# Patient Record
Sex: Male | Born: 1945 | Race: White | Hispanic: No | Marital: Married | Smoking: Former smoker
Health system: Southern US, Community
[De-identification: ages and names within clinical notes are randomized; demographics above are authoritative.]

## PROBLEM LIST (undated history)

## (undated) DIAGNOSIS — E119 Type 2 diabetes mellitus without complications: Secondary | ICD-10-CM

## (undated) DIAGNOSIS — E785 Hyperlipidemia, unspecified: Secondary | ICD-10-CM

## (undated) DIAGNOSIS — N400 Enlarged prostate without lower urinary tract symptoms: Secondary | ICD-10-CM

## (undated) DIAGNOSIS — J309 Allergic rhinitis, unspecified: Secondary | ICD-10-CM

## (undated) DIAGNOSIS — I10 Essential (primary) hypertension: Secondary | ICD-10-CM

## (undated) DIAGNOSIS — Z87442 Personal history of urinary calculi: Secondary | ICD-10-CM

## (undated) DIAGNOSIS — E7800 Pure hypercholesterolemia, unspecified: Secondary | ICD-10-CM

## (undated) HISTORY — PX: CYSTOSCOPY PROSTATE W/ LASER: SUR372

## (undated) HISTORY — PX: COLONOSCOPY: SHX174

## (undated) HISTORY — PX: LITHOTRIPSY: SUR834

---

## 2008-11-27 ENCOUNTER — Emergency Department: Payer: Self-pay | Admitting: Emergency Medicine

## 2009-05-22 ENCOUNTER — Ambulatory Visit: Payer: Self-pay | Admitting: Gastroenterology

## 2010-03-25 ENCOUNTER — Ambulatory Visit: Payer: Self-pay | Admitting: General Practice

## 2010-04-24 ENCOUNTER — Ambulatory Visit: Payer: Self-pay | Admitting: Urology

## 2010-05-20 ENCOUNTER — Ambulatory Visit: Payer: Self-pay | Admitting: Urology

## 2010-07-01 ENCOUNTER — Ambulatory Visit: Payer: Self-pay | Admitting: Urology

## 2010-07-02 ENCOUNTER — Ambulatory Visit: Payer: Self-pay | Admitting: Urology

## 2010-07-08 ENCOUNTER — Ambulatory Visit: Payer: Self-pay | Admitting: Urology

## 2011-03-02 ENCOUNTER — Ambulatory Visit: Payer: Self-pay | Admitting: Urology

## 2011-03-03 ENCOUNTER — Ambulatory Visit: Payer: Self-pay | Admitting: Urology

## 2011-04-29 ENCOUNTER — Other Ambulatory Visit (HOSPITAL_COMMUNITY): Payer: Self-pay | Admitting: Radiology

## 2011-08-10 IMAGING — CT CT ABD-PELV W/O CM
1 of 2 series · 15 of 32 positions shown, 19 images · non-contrast
Comparison: none

REASON FOR EXAM: ON METFORMIN hematuria abdominal pain
COMMENTS:

PROCEDURE:     KCT - KCT ABDOMEN/PELVIS WO  - April 24, 2010  [DATE]
RESULT:     Comparison: None
TECHNIQUE: Multiple axial images from the lung bases to the symphysis pubis
were obtained without oral and without intravenous contrast.

[Series 2: stone 3.0 i40f · axial · 0.71mm/px · z∈[-945,-585]mm · 15 of 135 slices shown, 19 images]
[im 10/135  soft-tissue]
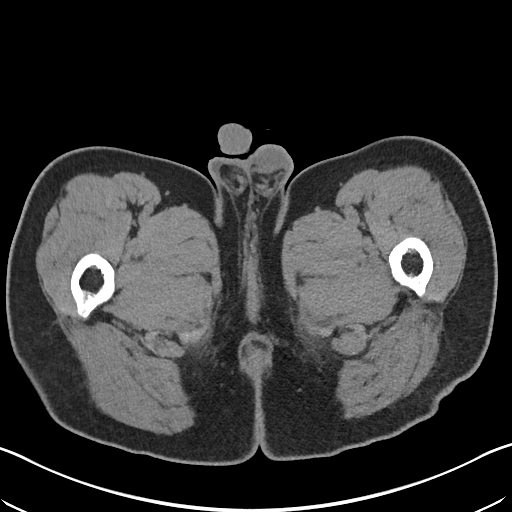
[im 10/135  bone]
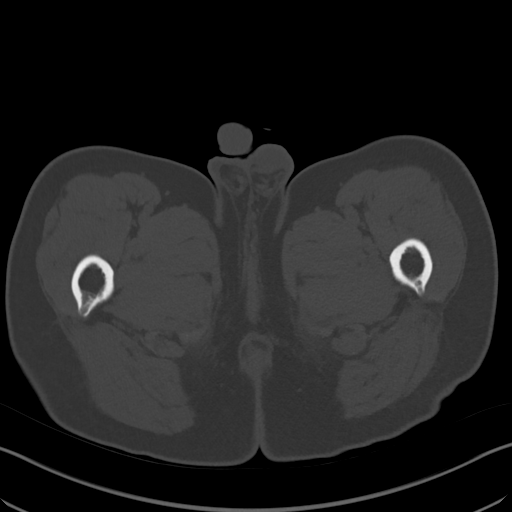
[im 20/135  soft-tissue]
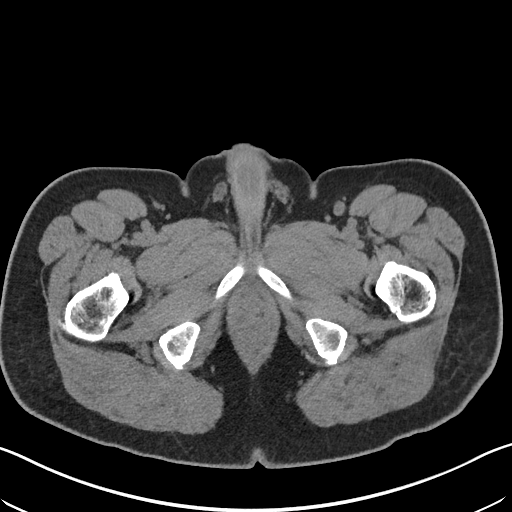
[im 30/135  soft-tissue]
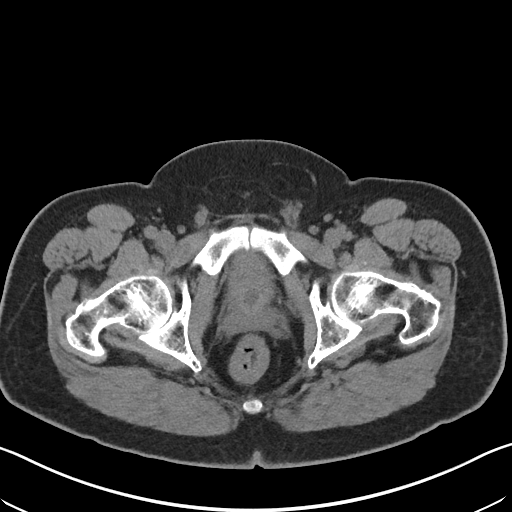
[im 40/135  soft-tissue]
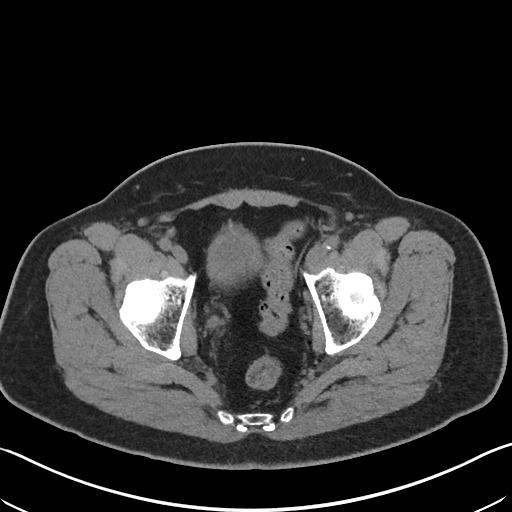
[im 50/135  soft-tissue]
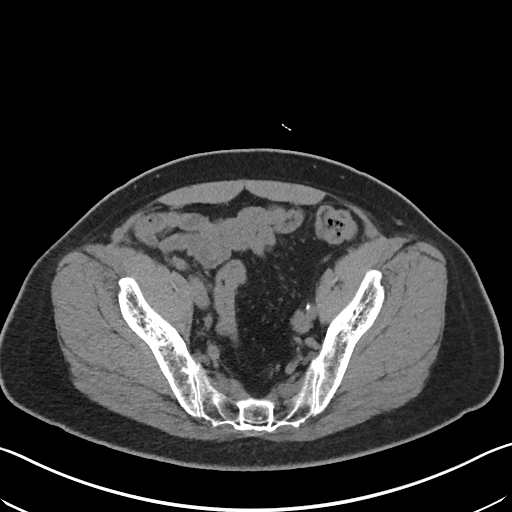
[im 60/135  soft-tissue]
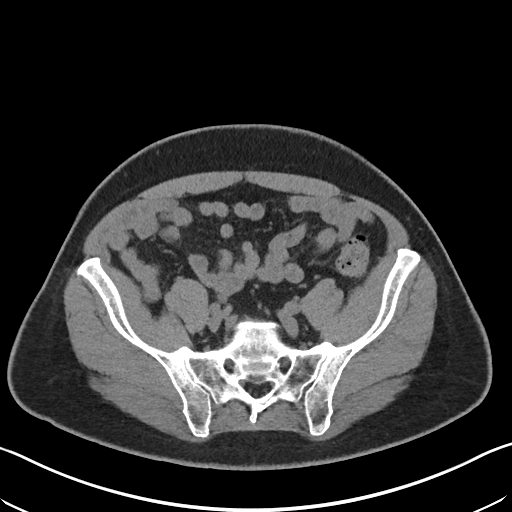
[im 70/135  soft-tissue]
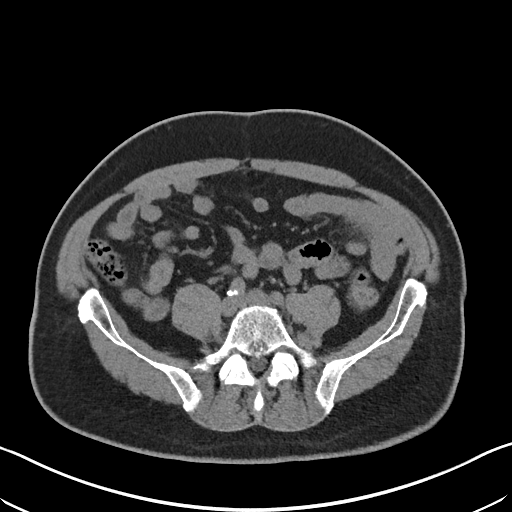
[im 80/135  soft-tissue]
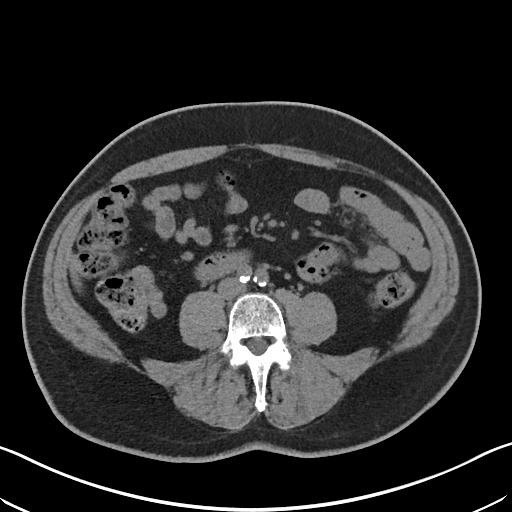
[im 90/135  soft-tissue]
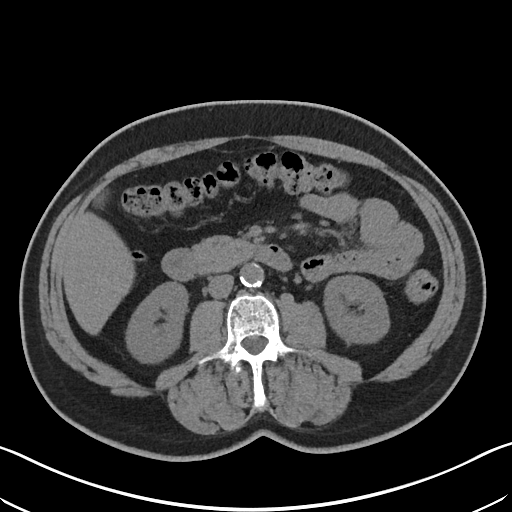
[im 90/135  bone]
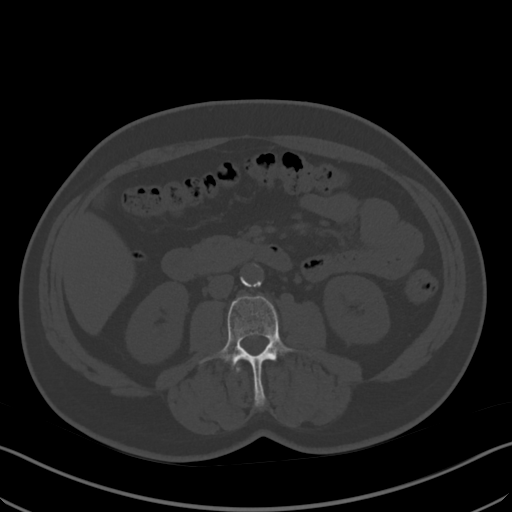
[im 100/135  soft-tissue]
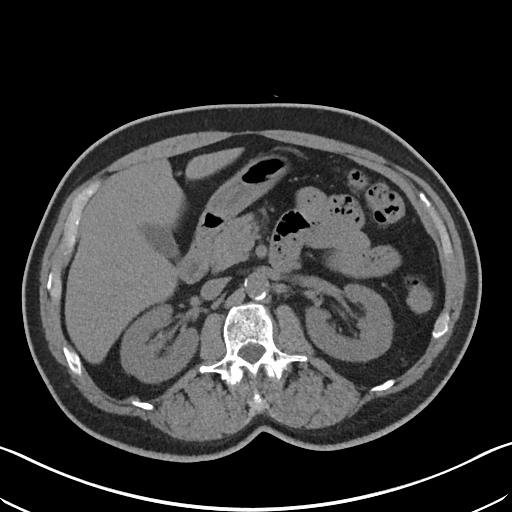
[im 110/135  soft-tissue]
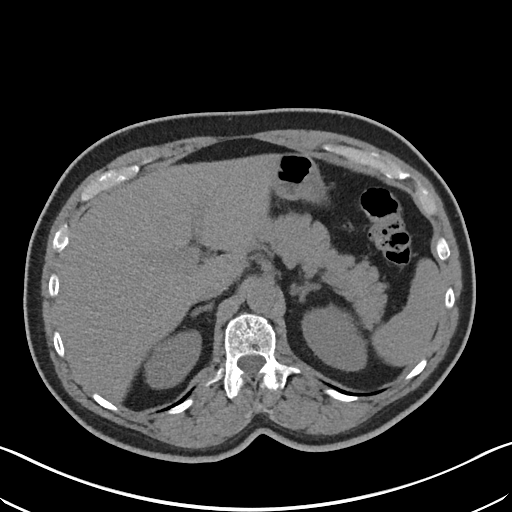
[im 115/135  lung]
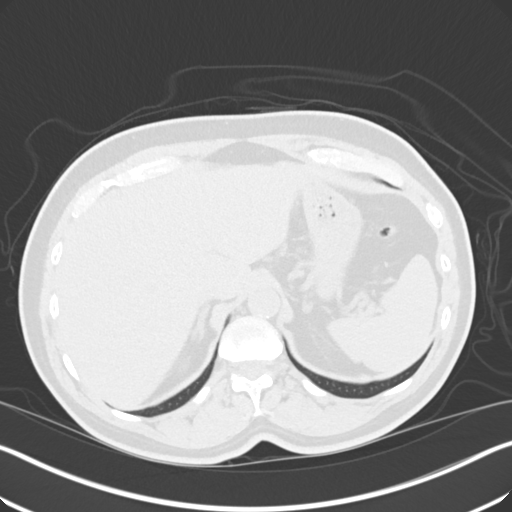
[im 120/135  soft-tissue]
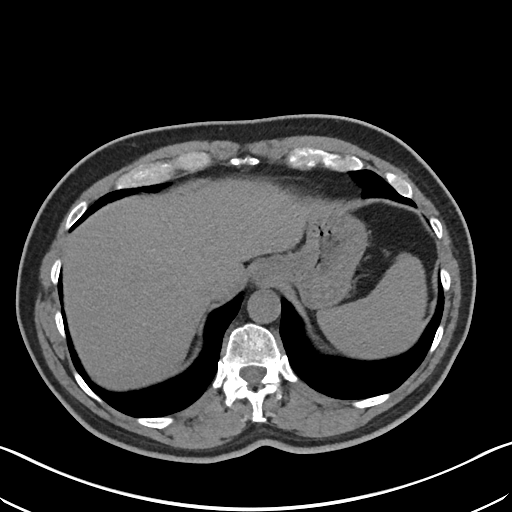
[im 120/135  lung]
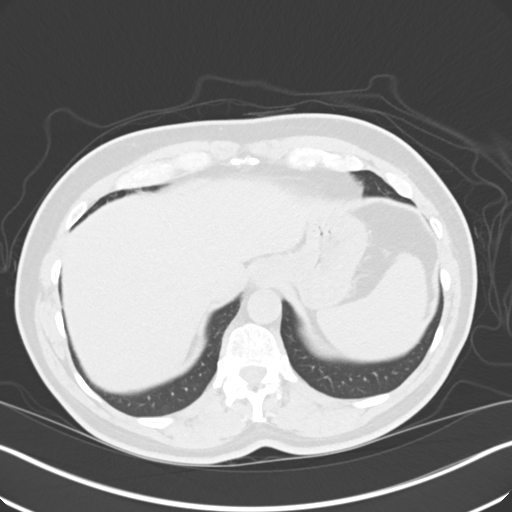
[im 125/135  lung]
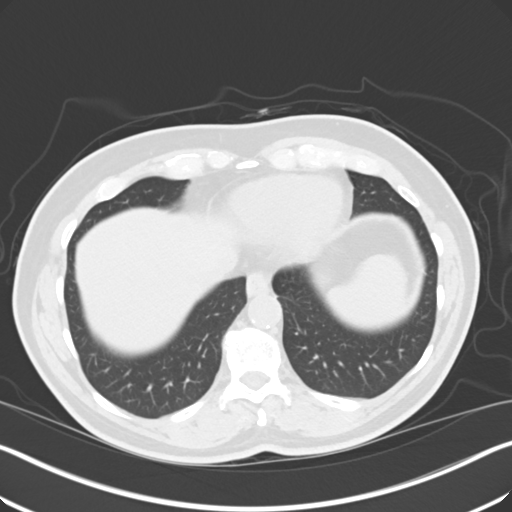
[im 130/135  soft-tissue]
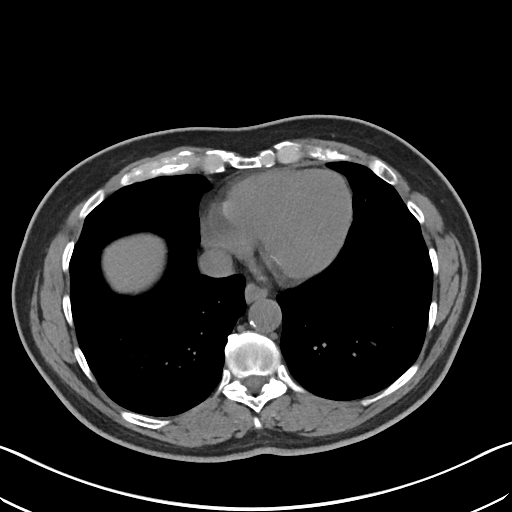
[im 130/135  lung]
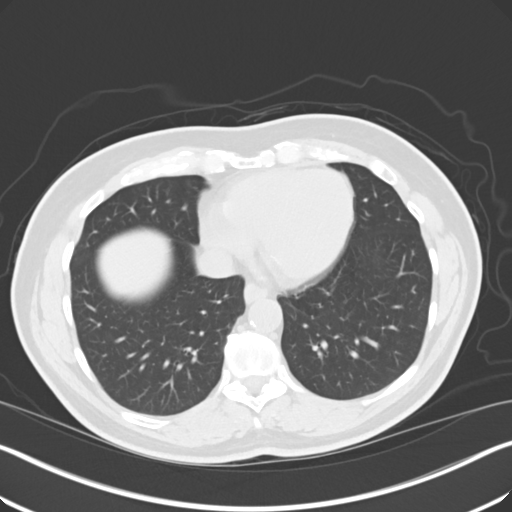

[15 of 32 positions shown; findings below may reference images not displayed]

FINDINGS: Lack of intravenous contrast limits evaluation the solid abdominal organs.
Grossly, the liver, spleen, pancreas are unremarkable. There are several
stones in the gallbladder. No CT evidence of acute cholecystitis. There is a
1.5 cm nodule in the left adrenal gland, with a density consistent with an
adenoma.

There are no renal calculi. There is a 6 mm down in the right ureterovesical
junction. There is mild dilatation of the mid and distal right ureter. No
hydronephrosis. There is a 2-3 mm calculus line dependently in the bladder.

There is diverticulosis of the sigmoid colon, without evidence of
diverticulitis. The appendix is normal. Mild thickening of the bladder wall
is likely related to underdistention. Atherosclerotic calcifications are
seen in the abdominal aorta and iliac arteries.

No aggressive lytic or sclerotic osseous lesions identified.
IMPRESSION: 1. 6 mm calculus at the right ureterovesical junction causing mild proximal
dilatation.
2. Left adrenal adenoma.
3. Cholelithiasis.

## 2012-02-29 ENCOUNTER — Ambulatory Visit: Payer: Self-pay | Admitting: Urology

## 2012-06-17 IMAGING — CR DG ABDOMEN 1V
1 series · 1 of 1 positions shown · non-contrast
Comparison: none

REASON FOR EXAM: Nephrolithiasis
COMMENTS:

PROCEDURE:     DXR - DXR KIDNEY URETER BLADDER  - March 02, 2011  [DATE]
RESULT:     Comparison is made to a prior exam of 07/01/2010.
No definite renal or ureteral calcifications are identified by routine
radiography. The bowel gas pattern is normal. No acute bony abnormalities
are seen.

[view not recorded]
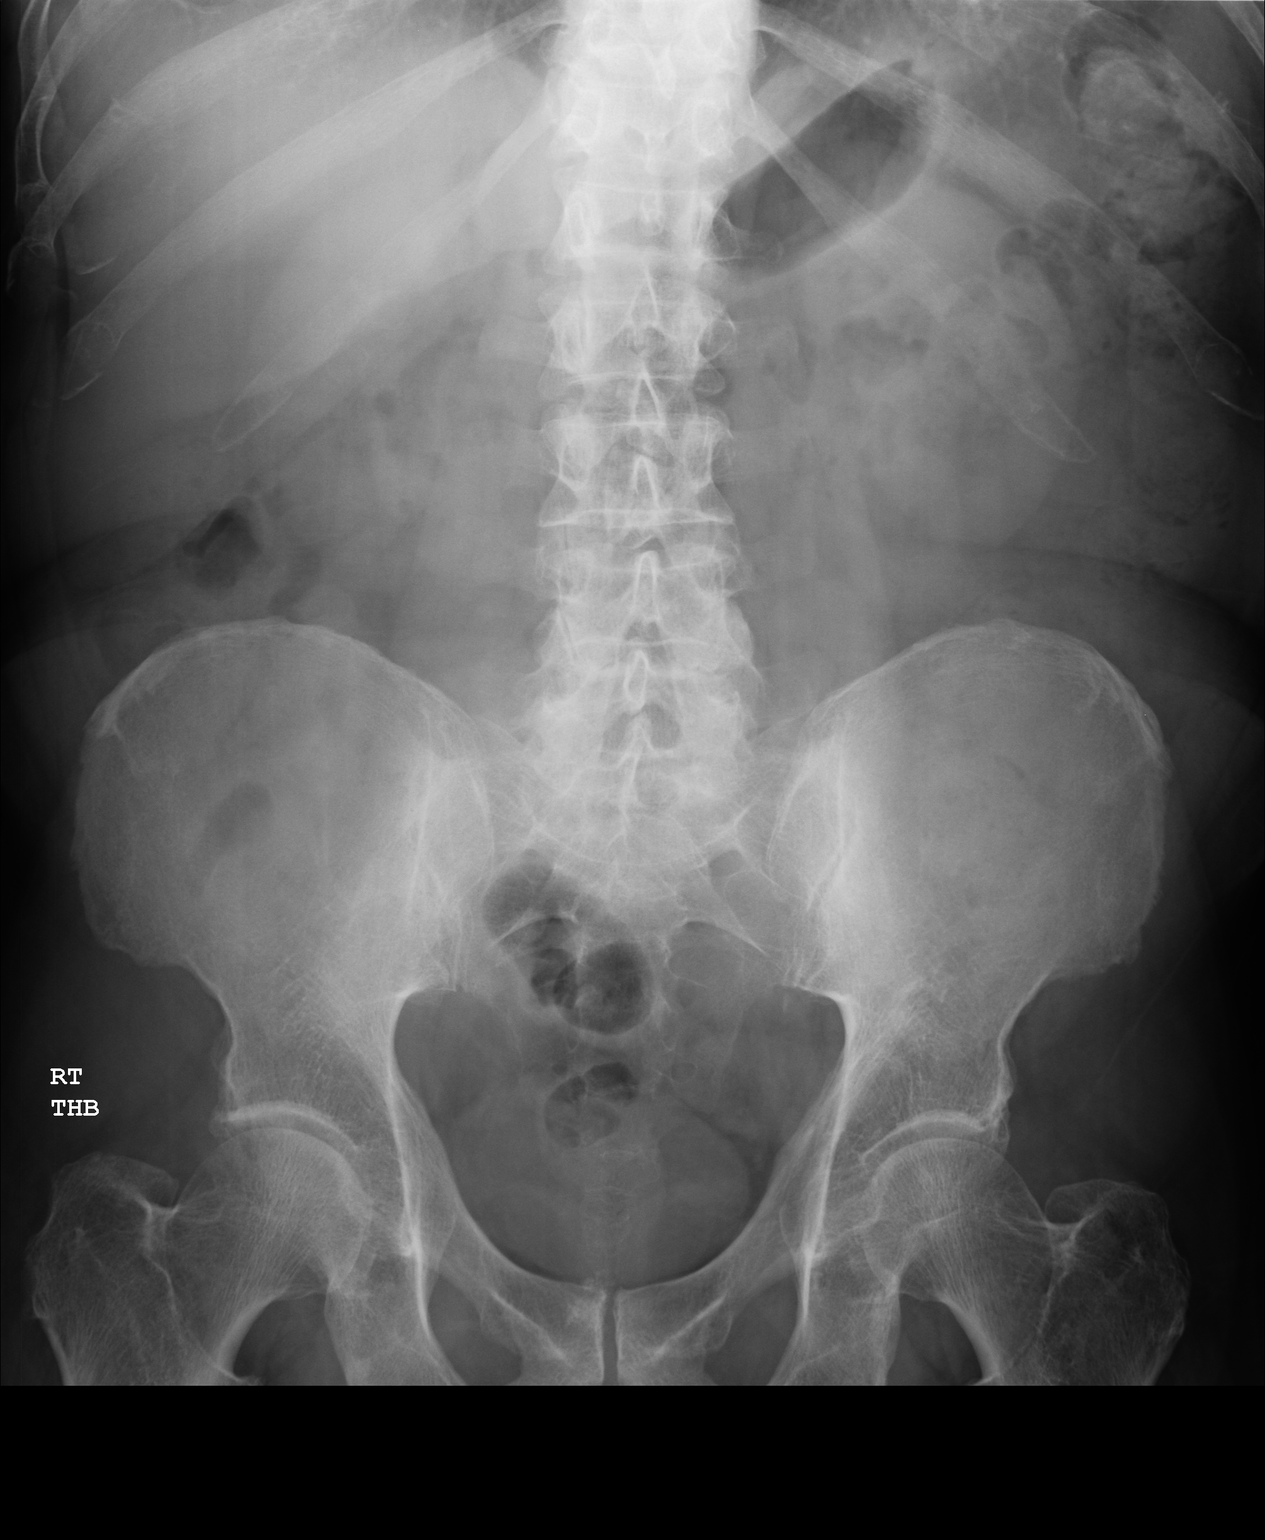

[1 of 1 positions shown; findings below may reference images not displayed]

IMPRESSION: No definite renal or ureteral calcifications are identified
by routine radiography.

## 2013-03-22 ENCOUNTER — Ambulatory Visit: Payer: Self-pay | Admitting: Urology

## 2014-07-08 IMAGING — CR DG ABDOMEN 1V
1 series · 2 of 2 positions shown · non-contrast
Comparison: none

REASON FOR EXAM: Nephrolothiasis, Renal Colic
COMMENTS:

PROCEDURE:     DXR - DXR KIDNEY URETER BLADDER  - March 22, 2013  [DATE]
RESULT:     Comparison is made to the study of 02/29/2012.
There is an unremarkable bowel gas pattern. No definite nephrolithiasis or
ureterolithiasis is appreciated. Degenerative changes are seen in the lower
lumbar spine.

[Series 1: supine kub · 0.17mm/px · 2 of 2 slices shown]
[im 1/2]
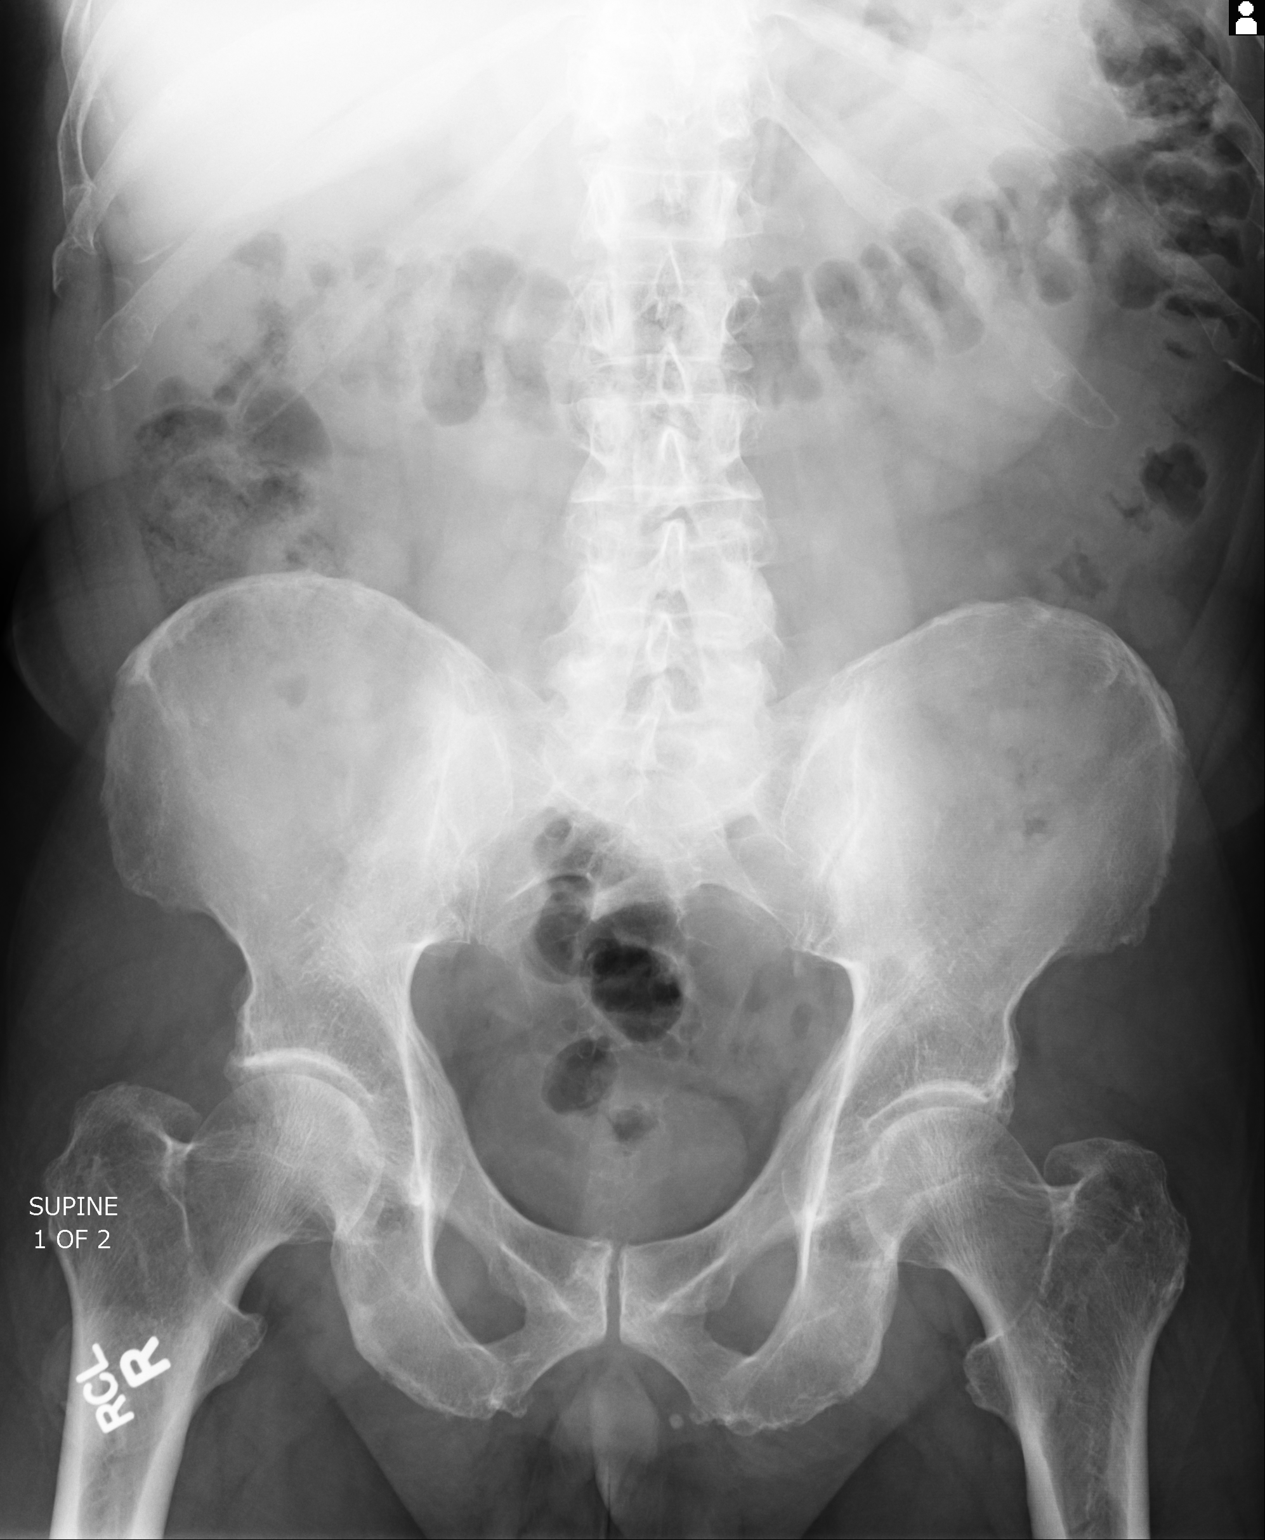
[im 2/2]
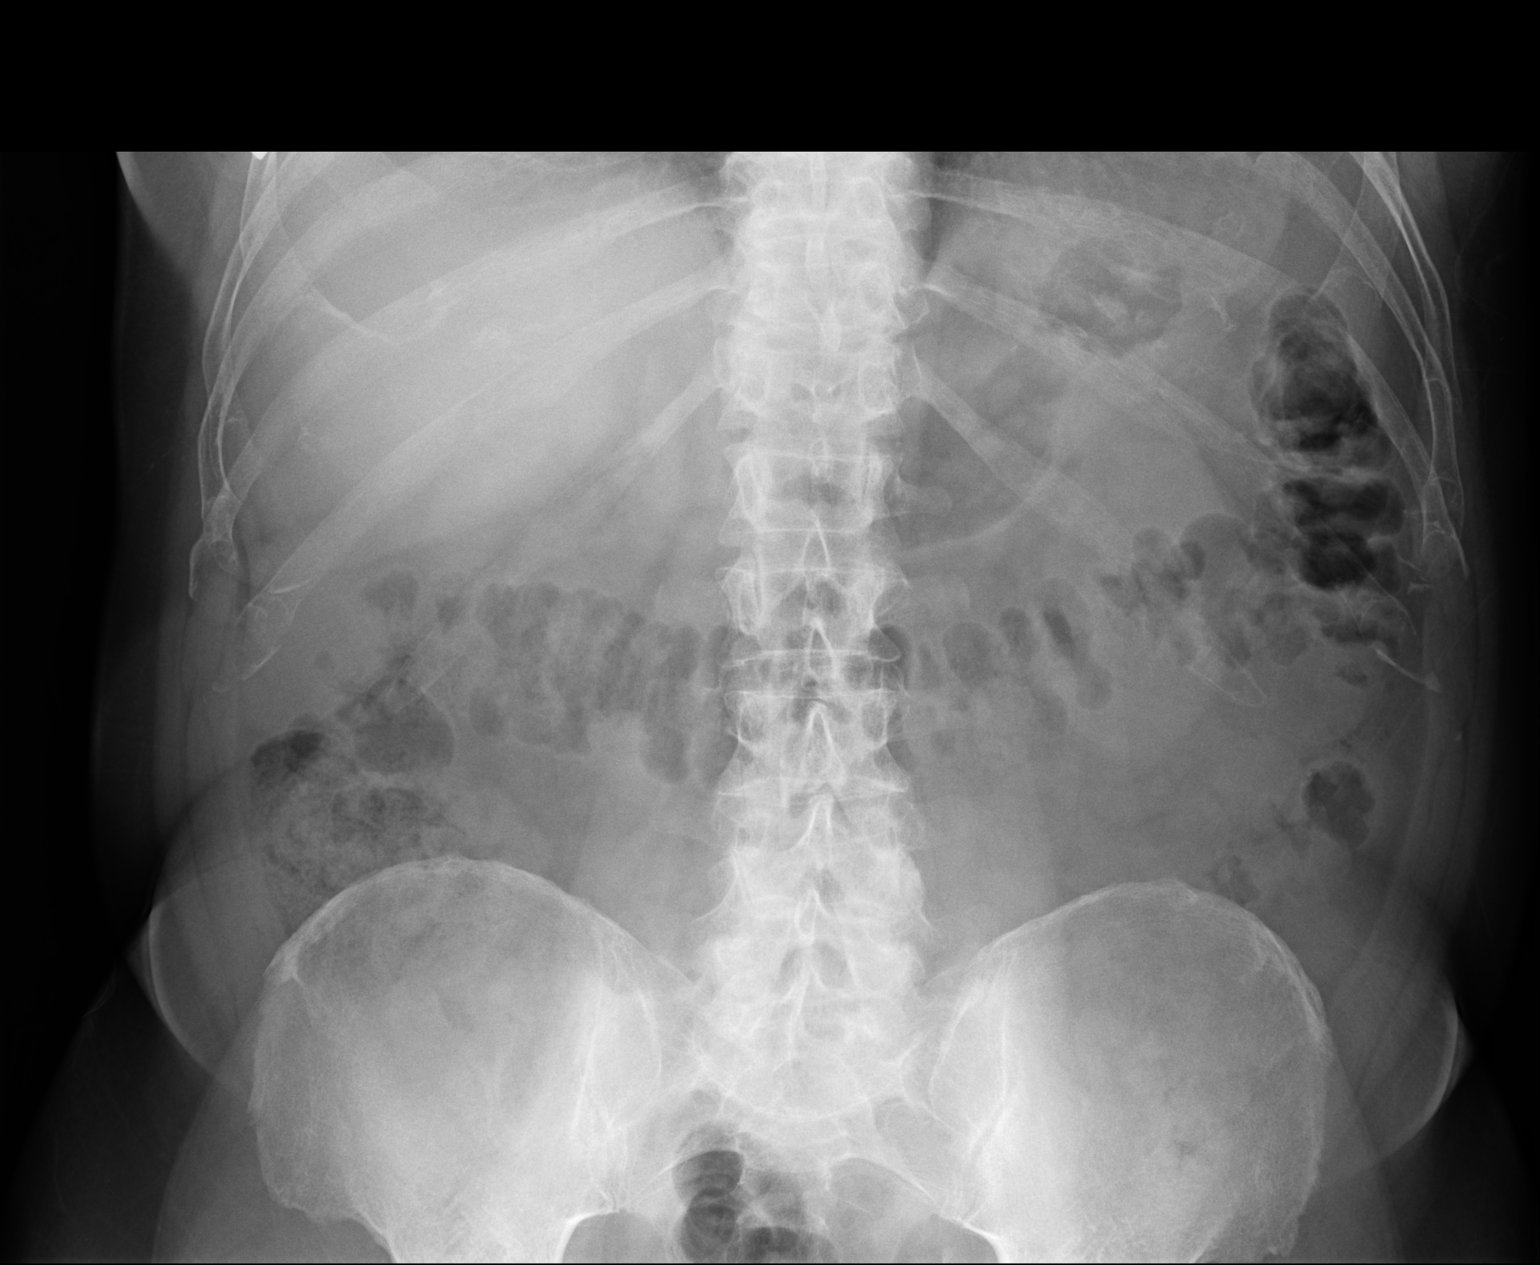

[2 of 2 positions shown; findings below may reference images not displayed]

IMPRESSION: No definite nephrolithiasis. No evidence of bowel
obstruction.

[REDACTED]

## 2020-04-30 ENCOUNTER — Other Ambulatory Visit: Payer: Self-pay | Admitting: Internal Medicine

## 2020-05-21 ENCOUNTER — Other Ambulatory Visit: Payer: Self-pay | Admitting: Internal Medicine

## 2020-05-21 DIAGNOSIS — R1032 Left lower quadrant pain: Secondary | ICD-10-CM

## 2020-05-21 DIAGNOSIS — K5792 Diverticulitis of intestine, part unspecified, without perforation or abscess without bleeding: Secondary | ICD-10-CM

## 2020-10-03 ENCOUNTER — Other Ambulatory Visit: Payer: Self-pay

## 2020-10-03 ENCOUNTER — Other Ambulatory Visit
Admission: RE | Admit: 2020-10-03 | Discharge: 2020-10-03 | Disposition: A | Discharge status: Home / Self Care | Payer: Medicare Other | Source: Ambulatory Visit | Attending: Gastroenterology | Admitting: Gastroenterology

## 2020-10-03 DIAGNOSIS — Z01812 Encounter for preprocedural laboratory examination: Secondary | ICD-10-CM | POA: Insufficient documentation

## 2020-10-03 DIAGNOSIS — Z20822 Contact with and (suspected) exposure to covid-19: Secondary | ICD-10-CM | POA: Insufficient documentation

## 2020-10-04 LAB — SARS CORONAVIRUS 2 (TAT 6-24 HRS): SARS Coronavirus 2: NEGATIVE

## 2020-10-06 ENCOUNTER — Encounter: Payer: Self-pay | Admitting: *Deleted

## 2020-10-07 ENCOUNTER — Encounter: Payer: Self-pay | Admitting: *Deleted

## 2020-10-07 ENCOUNTER — Ambulatory Visit
Admission: RE | Admit: 2020-10-07 | Discharge: 2020-10-07 | Disposition: A | Discharge status: Home / Self Care | Payer: Medicare Other | Attending: Gastroenterology | Admitting: Gastroenterology

## 2020-10-07 ENCOUNTER — Ambulatory Visit: Payer: Medicare Other | Admitting: Certified Registered"

## 2020-10-07 ENCOUNTER — Encounter
Admission: RE | Disposition: A | Discharge status: Home / Self Care | Payer: Self-pay | Source: Home / Self Care | Attending: Gastroenterology

## 2020-10-07 DIAGNOSIS — K635 Polyp of colon: Secondary | ICD-10-CM | POA: Insufficient documentation

## 2020-10-07 DIAGNOSIS — K621 Rectal polyp: Secondary | ICD-10-CM | POA: Insufficient documentation

## 2020-10-07 DIAGNOSIS — D123 Benign neoplasm of transverse colon: Secondary | ICD-10-CM | POA: Insufficient documentation

## 2020-10-07 DIAGNOSIS — Z880 Allergy status to penicillin: Secondary | ICD-10-CM | POA: Insufficient documentation

## 2020-10-07 DIAGNOSIS — Z7984 Long term (current) use of oral hypoglycemic drugs: Secondary | ICD-10-CM | POA: Diagnosis not present

## 2020-10-07 DIAGNOSIS — Z7982 Long term (current) use of aspirin: Secondary | ICD-10-CM | POA: Diagnosis not present

## 2020-10-07 DIAGNOSIS — Z79899 Other long term (current) drug therapy: Secondary | ICD-10-CM | POA: Diagnosis not present

## 2020-10-07 DIAGNOSIS — Z8719 Personal history of other diseases of the digestive system: Secondary | ICD-10-CM | POA: Diagnosis not present

## 2020-10-07 DIAGNOSIS — Z09 Encounter for follow-up examination after completed treatment for conditions other than malignant neoplasm: Secondary | ICD-10-CM | POA: Diagnosis not present

## 2020-10-07 DIAGNOSIS — D120 Benign neoplasm of cecum: Secondary | ICD-10-CM | POA: Diagnosis not present

## 2020-10-07 DIAGNOSIS — K5792 Diverticulitis of intestine, part unspecified, without perforation or abscess without bleeding: Secondary | ICD-10-CM | POA: Diagnosis present

## 2020-10-07 DIAGNOSIS — Z87891 Personal history of nicotine dependence: Secondary | ICD-10-CM | POA: Insufficient documentation

## 2020-10-07 HISTORY — DX: Benign prostatic hyperplasia without lower urinary tract symptoms: N40.0

## 2020-10-07 HISTORY — DX: Allergic rhinitis, unspecified: J30.9

## 2020-10-07 HISTORY — PX: COLONOSCOPY WITH PROPOFOL: SHX5780

## 2020-10-07 HISTORY — DX: Type 2 diabetes mellitus without complications: E11.9

## 2020-10-07 HISTORY — DX: Personal history of urinary calculi: Z87.442

## 2020-10-07 HISTORY — DX: Hyperlipidemia, unspecified: E78.5

## 2020-10-07 HISTORY — DX: Essential (primary) hypertension: I10

## 2020-10-07 HISTORY — DX: Pure hypercholesterolemia, unspecified: E78.00

## 2020-10-07 LAB — GLUCOSE, CAPILLARY: Glucose-Capillary: 164 mg/dL — ABNORMAL HIGH (ref 70–99)

## 2020-10-07 SURGERY — COLONOSCOPY WITH PROPOFOL
Anesthesia: General

## 2020-10-07 MED ORDER — GLYCOPYRROLATE 0.2 MG/ML IJ SOLN
INTRAMUSCULAR | Status: DC | PRN
  Administered 2020-10-07: .2 mg via INTRAVENOUS

## 2020-10-07 MED ORDER — SODIUM CHLORIDE 0.9 % IV SOLN: INTRAVENOUS | Status: DC

## 2020-10-07 MED ORDER — SODIUM CHLORIDE (PF) 0.9 % IJ SOLN
INTRAMUSCULAR | Status: DC | PRN
  Administered 2020-10-07: 2 mL via INTRAVENOUS

## 2020-10-07 MED ORDER — PROPOFOL 500 MG/50ML IV EMUL
INTRAVENOUS | Status: DC | PRN
  Administered 2020-10-07: 165 ug/kg/min via INTRAVENOUS

## 2020-10-07 MED ORDER — LIDOCAINE HCL (CARDIAC) PF 100 MG/5ML IV SOSY
PREFILLED_SYRINGE | INTRAVENOUS | Status: DC | PRN
  Administered 2020-10-07: 100 mg via INTRAVENOUS

## 2020-10-07 MED ORDER — PROPOFOL 10 MG/ML IV BOLUS
INTRAVENOUS | Status: DC | PRN
  Administered 2020-10-07: 10 mg via INTRAVENOUS
  Administered 2020-10-07: 60 mg via INTRAVENOUS
  Administered 2020-10-07: 10 mg via INTRAVENOUS
  Administered 2020-10-07: 20 mg via INTRAVENOUS

## 2020-10-07 NOTE — Anesthesia Preprocedure Evaluation (Addendum)
Anesthesia Evaluation  Patient identified by MRN, date of birth, ID band Patient awake    Reviewed: Allergy & Precautions, H&P , NPO status , Patient's Chart, lab work & pertinent test results  History of Anesthesia Complications Negative for: history of anesthetic complications  Airway Mallampati: II  TM Distance: >3 FB     Dental  (+) Chipped   Pulmonary neg sleep apnea, neg COPD, former smoker,    breath sounds clear to auscultation       Cardiovascular hypertension, (-) angina(-) Past MI and (-) Cardiac Stents (-) dysrhythmias  Rhythm:regular Rate:Normal     Neuro/Psych negative neurological ROS  negative psych ROS   GI/Hepatic negative GI ROS, Neg liver ROS,   Endo/Other  diabetes  Renal/GU negative Renal ROS  negative genitourinary   Musculoskeletal   Abdominal   Peds  Hematology negative hematology ROS (+)   Anesthesia Other Findings Past Medical History: No date: Allergic rhinitis No date: BPH (benign prostatic hyperplasia) No date: 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 No date: History of kidney stones No date: Hypercholesterolemia No date: Hyperlipidemia No date: Hypertension  Past Surgical History: No date: COLONOSCOPY No date: CYSTOSCOPY PROSTATE W/ LASER No date: LITHOTRIPSY     Reproductive/Obstetrics negative OB ROS                            Anesthesia Physical Anesthesia Plan  ASA: II  Anesthesia Plan: General   Post-op Pain Management:    Induction:   PONV Risk Score and Plan: Propofol infusion and TIVA  Airway Management Planned:   Additional Equipment:   Intra-op Plan:   Post-operative Plan:   Informed Consent: I have reviewed the patients History and Physical, chart, labs and discussed the procedure including the risks, benefits and alternatives for the proposed anesthesia with the patient or authorized  representative who has indicated his/her understanding and acceptance.     Dental Advisory Given  Plan Discussed with: Anesthesiologist, CRNA and Surgeon  Anesthesia Plan Comments:         Anesthesia Quick Evaluation

## 2020-10-07 NOTE — Anesthesia Procedure Notes (Signed)
Procedure Name: General with mask airway Performed by: Fletcher-Harrison, Aima Mcwhirt, CRNA Pre-anesthesia Checklist: Patient identified, Emergency Drugs available, Suction available and Patient being monitored Patient Re-evaluated:Patient Re-evaluated prior to induction Oxygen Delivery Method: Simple face mask Induction Type: IV induction Placement Confirmation: positive ETCO2 and CO2 detector Dental Injury: Teeth and Oropharynx as per pre-operative assessment        

## 2020-10-07 NOTE — H&P (Signed)
Outpatient short stay form Pre-procedure 10/07/2020 12:23 PM Raylene Miyamoto MD, MPH  Primary Physician:  Dr. Ouida Sills  Reason for visit:  History of diverticulitis  History of present illness:   75 y/o gentleman with history of complicated diverticulitis here for screening colonoscopy. No family history of GI malignancies. No abdominal surgeries. No blood thinners. Had normal colonoscopy in 2010. No pain currently.    Current Facility-Administered Medications:  .  0.9 %  sodium chloride infusion, , Intravenous, Continuous, Genelle Economou, Hilton Cork, MD, Last Rate: 20 mL/hr at 10/07/20 1210, New Bag at 10/07/20 1210  Medications Prior to Admission  Medication Sig Dispense Refill Last Dose  . amLODipine (NORVASC) 10 MG tablet Take 10 mg by mouth daily.   10/05/2020  . aspirin EC 81 MG tablet Take 81 mg by mouth daily. Swallow whole.   10/05/2020  . atorvastatin (LIPITOR) 40 MG tablet Take 40 mg by mouth daily.   10/05/2020  . cetirizine (ZYRTEC) 10 MG tablet Take 10 mg by mouth daily.   10/05/2020  . fluticasone (FLONASE) 50 MCG/ACT nasal spray Place 1 spray into both nostrils daily.   10/05/2020  . glipiZIDE (GLUCOTROL XL) 10 MG 24 hr tablet Take 10 mg by mouth daily with breakfast.   10/05/2020  . lisinopril (ZESTRIL) 40 MG tablet Take 40 mg by mouth daily.   10/05/2020  . metFORMIN (GLUCOPHAGE-XR) 500 MG 24 hr tablet Take 500 mg by mouth daily with breakfast.   10/05/2020     Allergies  Allergen Reactions  . Amoxil [Amoxicillin] Diarrhea     Past Medical History:  Diagnosis Date  . Allergic rhinitis   . BPH (benign prostatic hyperplasia)   . Diabetes mellitus without complication ([place])    type 2  . History of kidney stones   . Hypercholesterolemia   . Hyperlipidemia   . Hypertension     Review of systems:  Otherwise negative.    Physical Exam  Gen: Alert, oriented. Appears stated age.  HEENT: PERRLA. Lungs: No respiratory distress CV: RRR Abd: soft, benign, no masses Ext:  No edema    Planned procedures: Proceed with colonoscopy. The patient understands the nature of the planned procedure, indications, risks, alternatives and potential complications including but not limited to bleeding, infection, perforation, damage to internal organs and possible oversedation/side effects from anesthesia. The patient agrees and gives consent to proceed.  Please refer to procedure notes for findings, recommendations and patient disposition/instructions.     Raylene Miyamoto MD, MPH Gastroenterology 10/07/2020  12:23 PM

## 2020-10-07 NOTE — Interval H&P Note (Signed)
History and Physical Interval Note:  10/07/2020 12:25 PM  Caleb Houston  has presented today for surgery, with the diagnosis of HX OF POLYPS.  The various methods of treatment have been discussed with the patient and family. After consideration of risks, benefits and other options for treatment, the patient has consented to  Procedure(s): COLONOSCOPY WITH PROPOFOL (N/A) as a surgical intervention.  The patient's history has been reviewed, patient examined, no change in status, stable for surgery.  I have reviewed the patient's chart and labs.  Questions were answered to the patient's satisfaction.     Lesly Rubenstein  Ok to proceed with colonoscopy

## 2020-10-07 NOTE — Transfer of Care (Signed)
Immediate Anesthesia Transfer of Care Note  Patient: Caleb Houston  Procedure(s) Performed: COLONOSCOPY WITH PROPOFOL (N/A )  Patient Location: Endoscopy Unit  Anesthesia Type:General  Level of Consciousness: awake, drowsy and patient cooperative  Airway & Oxygen Therapy: Patient Spontanous Breathing and Patient connected to face mask oxygen  Post-op Assessment: Report given to RN and Post -op Vital signs reviewed and stable  Post vital signs: Reviewed and stable  Last Vitals:  Vitals Value Taken Time  BP 119/62 10/07/20 1315  Temp 36.1 C 10/07/20 1315  Pulse 80 10/07/20 1317  Resp 14 10/07/20 1317  SpO2 99 % 10/07/20 1317  Vitals shown include unvalidated device data.  Last Pain:  Vitals:   10/07/20 1315  TempSrc: Temporal  PainSc: Asleep         Complications: No complications documented.

## 2020-10-07 NOTE — Op Note (Addendum)
[facility] Gastroenterology Patient Name: Caleb Houston Procedure Date: 10/07/2020 12:21 PM MRN: [id number] Account #: [id number] Date of Birth: [birth year] Admit Type: Outpatient Age: 75 Room: [facility] ENDO ROOM 1 Gender: Male Note Status: Finalized Procedure:             Colonoscopy Indications:           Diverticulitis Providers:             Andrey Farmer MD, MD Medicines:             Monitored Anesthesia Care Complications:         No immediate complications. Estimated blood loss:                         Minimal. Procedure:             Pre-Anesthesia Assessment:                        - Prior to the procedure, a History and Physical was                         performed, and patient medications and allergies were                         reviewed. The patient is competent. The risks and                         benefits of the procedure and the sedation options and                         risks were discussed with the patient. All questions                         were answered and informed consent was obtained.                         Patient identification and proposed procedure were                         verified by the physician, the nurse, the anesthetist                         and the technician in the endoscopy suite. Mental                         Status Examination: alert and oriented. Airway                         Examination: normal oropharyngeal airway and neck                         mobility. Respiratory Examination: clear to                         auscultation. CV Examination: normal. Prophylactic                         Antibiotics: The patient does not require prophylactic  antibiotics. Prior Anticoagulants: The patient has                         taken no previous anticoagulant or antiplatelet                         agents. ASA Grade Assessment: II - A patient with mild                         systemic disease. After  reviewing the risks and                         benefits, the patient was deemed in satisfactory                         condition to undergo the procedure. The anesthesia                         plan was to use monitored anesthesia care (MAC).                         Immediately prior to administration of medications,                         the patient was re-assessed for adequacy to receive                         sedatives. The heart rate, respiratory rate, oxygen                         saturations, blood pressure, adequacy of pulmonary                         ventilation, and response to care were monitored                         throughout the procedure. The physical status of the                         patient was re-assessed after the procedure.                        After obtaining informed consent, the colonoscope was                         passed under direct vision. Throughout the procedure,                         the patient's blood pressure, pulse, and oxygen                         saturations were monitored continuously. The                         Colonoscope was introduced through the anus and                         advanced to the the cecum, identified by appendiceal  orifice and ileocecal valve. The patient tolerated the                         procedure well. The quality of the bowel preparation                         was good. The colonoscopy was technically difficult                         and complex due to restricted mobility of the colon.                         Successful completion of the procedure was aided by                         withdrawing the scope and replacing with the pediatric                         colonoscope. Findings:      The perianal and digital rectal examinations were normal.      A 3 mm polyp was found in the cecum. The polyp was sessile. The polyp       was removed with a cold snare. Resection and retrieval  were complete.       Estimated blood loss was minimal.      A 2 mm polyp was found in the ascending colon. The polyp was sessile.       The polyp was removed with a jumbo cold forceps. Resection and retrieval       were complete. Estimated blood loss was minimal.      A 10 mm polyp was found in the hepatic flexure. The polyp was sessile.       The polyp was removed with a saline injection-lift technique using a hot       snare. Resection and retrieval were complete. Estimated blood loss was       minimal. To prevent bleeding post-intervention, one hemostatic clip was       successfully placed. There was no bleeding during, or at the end, of the       procedure.      A 10 mm polyp was found in the transverse colon. The polyp was       pedunculated. The polyp was removed with a hot snare. Resection and       retrieval were complete. Estimated blood loss was minimal.      Two sessile polyps were found in the transverse colon. The polyps were 1       to 2 mm in size. These polyps were removed with a jumbo cold forceps.       Resection and retrieval were complete. Estimated blood loss was minimal.      A 2 mm polyp was found in the sigmoid colon. The polyp was sessile. The       polyp was removed with a jumbo cold forceps. Resection and retrieval       were complete. Estimated blood loss was minimal.      A 2 mm polyp was found in the recto-sigmoid colon. The polyp was       sessile. The polyp was removed with a jumbo cold forceps. Resection and       retrieval were complete. Estimated blood  loss was minimal.      A 2 mm polyp was found in the rectum. The polyp was sessile. The polyp       was removed with a jumbo cold forceps. Resection and retrieval were       complete. Estimated blood loss was minimal. Impression:            - One 3 mm polyp in the cecum, removed with a cold                         snare. Resected and retrieved.                        - One 2 mm polyp in the ascending  colon, removed with                         a jumbo cold forceps. Resected and retrieved.                        - One 10 mm polyp at the hepatic flexure, removed                         using injection-lift and a hot snare. Resected and                         retrieved. Clip was placed.                        - One 10 mm polyp in the transverse colon, removed                         with a hot snare. Resected and retrieved.                        - Two 1 to 2 mm polyps in the transverse colon,                         removed with a jumbo cold forceps. Resected and                         retrieved.                        - One 2 mm polyp in the sigmoid colon, removed with a                         jumbo cold forceps. Resected and retrieved.                        - One 2 mm polyp at the recto-sigmoid colon, removed                         with a jumbo cold forceps. Resected and retrieved.                        - One 2 mm polyp in the rectum, removed with a jumbo  cold forceps. Resected and retrieved. Recommendation:        - Repeat colonoscopy for surveillance based on                         pathology results.                        - Return to referring physician as previously                         scheduled.                        - Discharge patient to home.                        - Resume previous diet.                        - Continue present medications.                        - Await pathology results. Procedure Code(s):     --- Professional ---                        [phone], Colonoscopy, flexible; with removal of                         tumor(s), polyp(s), or other lesion(s) by snare                         technique                        45380, 51, Colonoscopy, flexible; with biopsy, single                         or multiple                        45381, Colonoscopy, flexible; with directed submucosal                         injection(s), any  substance Diagnosis Code(s):     --- Professional ---                        K63.5, Polyp of colon                        K62.1, Rectal polyp                        K57.32, Diverticulitis of large intestine without                         perforation or abscess without bleeding CPT copyright 2019 American Medical Association. All rights reserved. The codes documented in this report are preliminary and upon coder review may  be revised to meet current compliance requirements. Andrey Farmer MD, MD 10/07/2020 1:25:41 PM Number of Addenda: 0 Note Initiated On: 10/07/2020 12:21 PM Scope Withdrawal Time: 0 hours 24 minutes 18 seconds  Total Procedure Duration: 0 hours 39  minutes 16 seconds  Estimated Blood Loss:  Estimated blood loss was minimal.      [facility]

## 2020-10-08 LAB — SURGICAL PATHOLOGY

## 2020-10-08 NOTE — Anesthesia Postprocedure Evaluation (Signed)
Anesthesia Post Note  Patient: Caleb Houston  Procedure(s) Performed: COLONOSCOPY WITH PROPOFOL (N/A )  Patient location during evaluation: PACU Anesthesia Type: General Level of consciousness: awake and alert Pain management: pain level controlled Vital Signs Assessment: post-procedure vital signs reviewed and stable Respiratory status: spontaneous breathing, nonlabored ventilation and respiratory function stable Cardiovascular status: blood pressure returned to baseline and stable Postop Assessment: no apparent nausea or vomiting Anesthetic complications: no   No complications documented.   Last Vitals:  Vitals:   10/07/20 1340 10/07/20 1342  BP: (!) 187/80 (!) 143/81  Pulse: 85 84  Resp: 16 16  Temp:    SpO2: 100% 100%    Last Pain:  Vitals:   10/08/20 0743  TempSrc:   PainSc: 0-No pain                 Tera Mater

## 2021-08-07 ENCOUNTER — Other Ambulatory Visit: Payer: Self-pay | Admitting: Orthopedic Surgery

## 2021-08-07 DIAGNOSIS — M25312 Other instability, left shoulder: Secondary | ICD-10-CM

## 2021-08-07 DIAGNOSIS — G8929 Other chronic pain: Secondary | ICD-10-CM

## 2021-08-07 DIAGNOSIS — S46002A Unspecified injury of muscle(s) and tendon(s) of the rotator cuff of left shoulder, initial encounter: Secondary | ICD-10-CM

## 2021-08-14 ENCOUNTER — Ambulatory Visit
Admission: RE | Admit: 2021-08-14 | Discharge: 2021-08-14 | Disposition: A | Discharge status: Home / Self Care | Payer: Medicare Other | Source: Ambulatory Visit | Attending: Orthopedic Surgery | Admitting: Orthopedic Surgery

## 2021-08-14 ENCOUNTER — Other Ambulatory Visit: Payer: Self-pay

## 2021-08-14 DIAGNOSIS — M25512 Pain in left shoulder: Secondary | ICD-10-CM | POA: Diagnosis not present

## 2021-08-14 DIAGNOSIS — S46002A Unspecified injury of muscle(s) and tendon(s) of the rotator cuff of left shoulder, initial encounter: Secondary | ICD-10-CM

## 2021-08-14 DIAGNOSIS — G8929 Other chronic pain: Secondary | ICD-10-CM | POA: Diagnosis present

## 2021-08-14 DIAGNOSIS — M25312 Other instability, left shoulder: Secondary | ICD-10-CM | POA: Insufficient documentation

## 2022-04-02 ENCOUNTER — Other Ambulatory Visit: Payer: Self-pay | Admitting: Internal Medicine

## 2022-04-02 DIAGNOSIS — N289 Disorder of kidney and ureter, unspecified: Secondary | ICD-10-CM

## 2022-04-05 ENCOUNTER — Ambulatory Visit
Admission: RE | Admit: 2022-04-05 | Discharge: 2022-04-05 | Disposition: A | Discharge status: Home / Self Care | Payer: Medicare Other | Source: Ambulatory Visit | Attending: Internal Medicine | Admitting: Internal Medicine

## 2022-04-05 DIAGNOSIS — N289 Disorder of kidney and ureter, unspecified: Secondary | ICD-10-CM

## 2023-01-14 ENCOUNTER — Encounter: Payer: Self-pay | Admitting: *Deleted

## 2023-01-18 ENCOUNTER — Ambulatory Visit: Payer: Medicare Other | Admitting: Anesthesiology

## 2023-01-18 ENCOUNTER — Encounter
Admission: RE | Disposition: A | Discharge status: Home / Self Care | Payer: Self-pay | Source: Home / Self Care | Attending: Gastroenterology

## 2023-01-18 ENCOUNTER — Ambulatory Visit
Admission: RE | Admit: 2023-01-18 | Discharge: 2023-01-18 | Disposition: A | Discharge status: Home / Self Care | Payer: Medicare Other | Attending: Gastroenterology | Admitting: Gastroenterology

## 2023-01-18 ENCOUNTER — Encounter: Payer: Self-pay | Admitting: *Deleted

## 2023-01-18 DIAGNOSIS — K640 First degree hemorrhoids: Secondary | ICD-10-CM | POA: Diagnosis not present

## 2023-01-18 DIAGNOSIS — Z7982 Long term (current) use of aspirin: Secondary | ICD-10-CM | POA: Insufficient documentation

## 2023-01-18 DIAGNOSIS — Z7984 Long term (current) use of oral hypoglycemic drugs: Secondary | ICD-10-CM | POA: Insufficient documentation

## 2023-01-18 DIAGNOSIS — E785 Hyperlipidemia, unspecified: Secondary | ICD-10-CM | POA: Diagnosis not present

## 2023-01-18 DIAGNOSIS — Z79899 Other long term (current) drug therapy: Secondary | ICD-10-CM | POA: Diagnosis not present

## 2023-01-18 DIAGNOSIS — K529 Noninfective gastroenteritis and colitis, unspecified: Secondary | ICD-10-CM | POA: Insufficient documentation

## 2023-01-18 DIAGNOSIS — I10 Essential (primary) hypertension: Secondary | ICD-10-CM | POA: Insufficient documentation

## 2023-01-18 DIAGNOSIS — Z86010 Personal history of colonic polyps: Secondary | ICD-10-CM | POA: Diagnosis not present

## 2023-01-18 DIAGNOSIS — E7800 Pure hypercholesterolemia, unspecified: Secondary | ICD-10-CM | POA: Diagnosis not present

## 2023-01-18 DIAGNOSIS — D124 Benign neoplasm of descending colon: Secondary | ICD-10-CM | POA: Diagnosis not present

## 2023-01-18 DIAGNOSIS — E119 Type 2 diabetes mellitus without complications: Secondary | ICD-10-CM | POA: Diagnosis not present

## 2023-01-18 HISTORY — PX: COLONOSCOPY: SHX5424

## 2023-01-18 LAB — GLUCOSE, CAPILLARY: Glucose-Capillary: 143 mg/dL — ABNORMAL HIGH (ref 70–99)

## 2023-01-18 SURGERY — COLONOSCOPY
Anesthesia: General

## 2023-01-18 MED ORDER — PROPOFOL 10 MG/ML IV BOLUS
INTRAVENOUS | Status: DC | PRN
  Administered 2023-01-18: 20 mg via INTRAVENOUS
  Administered 2023-01-18: 80 mg via INTRAVENOUS

## 2023-01-18 MED ORDER — LIDOCAINE HCL (PF) 2 % IJ SOLN
INTRAMUSCULAR | Status: AC
  Filled 2023-01-18: qty 5

## 2023-01-18 MED ORDER — STERILE WATER FOR IRRIGATION IR SOLN
Status: DC | PRN
  Administered 2023-01-18: 240 mL

## 2023-01-18 MED ORDER — PROPOFOL 500 MG/50ML IV EMUL
INTRAVENOUS | Status: DC | PRN
  Administered 2023-01-18: 150 ug/kg/min via INTRAVENOUS

## 2023-01-18 MED ORDER — LIDOCAINE HCL (CARDIAC) PF 100 MG/5ML IV SOSY
PREFILLED_SYRINGE | INTRAVENOUS | Status: DC | PRN
  Administered 2023-01-18: 20 mg via INTRAVENOUS

## 2023-01-18 MED ORDER — PROPOFOL 1000 MG/100ML IV EMUL
INTRAVENOUS | Status: AC
  Filled 2023-01-18: qty 100

## 2023-01-18 MED ORDER — SODIUM CHLORIDE 0.9 % IV SOLN: INTRAVENOUS | Status: DC

## 2023-01-18 NOTE — H&P (Signed)
Outpatient short stay form Pre-procedure 01/18/2023  Regis Bill, MD  Primary Physician: Lauro Regulus, MD  Reason for visit:  Chronic diarrhea  History of present illness:    77 y/o gentleman with history of hypertension, HLD, and DM II here for colonoscopy for chronic diarrhea. Last colonoscopy in 2022 with several polyps including two 10 mm polyps. No blood thinners. No significant abdominal surgeries. No family history of GI malignancies.    Current Facility-Administered Medications:    0.9 %  sodium chloride infusion, , Intravenous, Continuous, Jermel Artley, Rossie Muskrat, MD, Last Rate: 20 mL/hr at 01/18/23 1251, New Bag at 01/18/23 1251  Medications Prior to Admission  Medication Sig Dispense Refill Last Dose   amLODipine (NORVASC) 10 MG tablet Take 10 mg by mouth daily.   01/17/2023   atorvastatin (LIPITOR) 40 MG tablet Take 40 mg by mouth daily.   01/17/2023   glipiZIDE (GLUCOTROL XL) 10 MG 24 hr tablet Take 10 mg by mouth daily with breakfast.   01/17/2023   lisinopril (ZESTRIL) 40 MG tablet Take 40 mg by mouth daily.   01/17/2023   metFORMIN (GLUCOPHAGE-XR) 500 MG 24 hr tablet Take 500 mg by mouth daily with breakfast.   01/17/2023   aspirin EC 81 MG tablet Take 81 mg by mouth daily. Swallow whole.      cetirizine (ZYRTEC) 10 MG tablet Take 10 mg by mouth daily.      fluticasone (FLONASE) 50 MCG/ACT nasal spray Place 1 spray into both nostrils daily.        Allergies  Allergen Reactions   Amoxil [Amoxicillin] Diarrhea     Past Medical History:  Diagnosis Date   Allergic rhinitis    BPH (benign prostatic hyperplasia)    Diabetes mellitus without complication (HCC)    type 2   History of kidney stones    Hypercholesterolemia    Hyperlipidemia    Hypertension     Review of systems:  Otherwise negative.    Physical Exam  Gen: Alert, oriented. Appears stated age.  HEENT: PERRLA. Lungs: No respiratory distress CV: RRR Abd: soft, benign, no masses Ext: No  edema    Planned procedures: Proceed with colonoscopy. The patient understands the nature of the planned procedure, indications, risks, alternatives and potential complications including but not limited to bleeding, infection, perforation, damage to internal organs and possible oversedation/side effects from anesthesia. The patient agrees and gives consent to proceed.  Please refer to procedure notes for findings, recommendations and patient disposition/instructions.     Regis Bill, MD [facility] Gastroenterology

## 2023-01-18 NOTE — Interval H&P Note (Signed)
History and Physical Interval Note:  01/18/2023 1:16 PM  Caleb Houston  has presented today for surgery, with the diagnosis of FM HX COLON POLYPS.  The various methods of treatment have been discussed with the patient and family. After consideration of risks, benefits and other options for treatment, the patient has consented to  Procedure(s): COLONOSCOPY (N/A) as a surgical intervention.  The patient's history has been reviewed, patient examined, no change in status, stable for surgery.  I have reviewed the patient's chart and labs.  Questions were answered to the patient's satisfaction.     Regis Bill  Ok to proceed with colonoscopy

## 2023-01-18 NOTE — Anesthesia Postprocedure Evaluation (Signed)
Anesthesia Post Note  Patient: RUDELL RUTHERFORD  Procedure(s) Performed: COLONOSCOPY  Patient location during evaluation: PACU Anesthesia Type: General Level of consciousness: awake and alert Pain management: pain level controlled Vital Signs Assessment: post-procedure vital signs reviewed and stable Respiratory status: spontaneous breathing, nonlabored ventilation, respiratory function stable and patient connected to nasal cannula oxygen Cardiovascular status: blood pressure returned to baseline and stable Postop Assessment: no apparent nausea or vomiting Anesthetic complications: no   No notable events documented.   Last Vitals:  Vitals:   01/18/23 1346 01/18/23 1356  BP: 118/62 130/66  Pulse: 81 84  Resp: 20 17  Temp: (!) 35.9 C   SpO2: 96% 100%    Last Pain:  Vitals:   01/18/23 1356  TempSrc:   PainSc: 0-No pain                 Yevette Edwards

## 2023-01-18 NOTE — Transfer of Care (Signed)
Immediate Anesthesia Transfer of Care Note  Patient: Caleb Houston  Procedure(s) Performed: COLONOSCOPY  Patient Location: PACU  Anesthesia Type:General  Level of Consciousness: drowsy  Airway & Oxygen Therapy: Patient Spontanous Breathing  Post-op Assessment: Report given to RN and Post -op Vital signs reviewed and stable  Post vital signs: Reviewed and stable  Last Vitals:  Vitals Value Taken Time  BP 118/62 01/18/23 1346  Temp 35.9 C 01/18/23 1346  Pulse 80 01/18/23 1347  Resp 20 01/18/23 1347  SpO2 96 % 01/18/23 1347  Vitals shown include unvalidated device data.  Last Pain:  Vitals:   01/18/23 1346  TempSrc: Temporal  PainSc: Asleep         Complications: No notable events documented.

## 2023-01-18 NOTE — Op Note (Signed)
[facility] Patient Name: Caleb Houston Procedure Date: 01/18/2023 1:13 PM MRN: [id number] Account #: [id number] Date of Birth: [birth year] Admit Type: Outpatient Age: 77 Room: [facility] ENDO ROOM 1 Gender: Male Note Status: Supervisor Override Instrument Name: Peds Colonoscope [id number] Procedure:             Colonoscopy Indications:           Personal history of colonic polyps, Chronic diarrhea Providers:             Eather Colas MD, MD Referring MD:          Marya Amsler. Dareen Piano MD, MD (Referring MD) Medicines:             Monitored Anesthesia Care Complications:         No immediate complications. Estimated blood loss:                         Minimal. Procedure:             Pre-Anesthesia Assessment:                        - Prior to the procedure, a History and Physical was                         performed, and patient medications and allergies were                         reviewed. The patient is competent. The risks and                         benefits of the procedure and the sedation options and                         risks were discussed with the patient. All questions                         were answered and informed consent was obtained.                         Patient identification and proposed procedure were                         verified by the physician, the nurse, the                         anesthesiologist, the anesthetist and the technician                         in the endoscopy suite. Mental Status Examination:                         alert and oriented. Airway Examination: normal                         oropharyngeal airway and neck mobility. Respiratory                         Examination: clear to auscultation. CV Examination:  normal. Prophylactic Antibiotics: The patient does not                         require prophylactic antibiotics. Prior                         Anticoagulants: The patient has  taken no anticoagulant                         or antiplatelet agents. ASA Grade Assessment: II - A                         patient with mild systemic disease. After reviewing                         the risks and benefits, the patient was deemed in                         satisfactory condition to undergo the procedure. The                         anesthesia plan was to use monitored anesthesia care                         (MAC). Immediately prior to administration of                         medications, the patient was re-assessed for adequacy                         to receive sedatives. The heart rate, respiratory                         rate, oxygen saturations, blood pressure, adequacy of                         pulmonary ventilation, and response to care were                         monitored throughout the procedure. The physical                         status of the patient was re-assessed after the                         procedure.                        After obtaining informed consent, the colonoscope was                         passed under direct vision. Throughout the procedure,                         the patient's blood pressure, pulse, and oxygen                         saturations were monitored continuously. The  Colonoscope was introduced through the anus and                         advanced to the the terminal ileum. The colonoscopy                         was performed without difficulty. The patient                         tolerated the procedure well. The quality of the bowel                         preparation was good. The terminal ileum, ileocecal                         valve, appendiceal orifice, and rectum were                         photographed. Findings:      The perianal and digital rectal examinations were normal.      The terminal ileum appeared normal.      Normal mucosa was found in the entire colon. Biopsies for histology  were       taken with a cold forceps from the entire colon for evaluation of       microscopic colitis. Estimated blood loss was minimal.      A 1 mm polyp was found in the descending colon. The polyp was sessile.       The polyp was removed with a jumbo cold forceps. Resection and retrieval       were complete. Estimated blood loss was minimal.      A 3 mm polyp was found in the descending colon. The polyp was sessile.       The polyp was removed with a cold snare. Resection and retrieval were       complete. Estimated blood loss was minimal.      Internal hemorrhoids were found during retroflexion. The hemorrhoids       were Grade I (internal hemorrhoids that do not prolapse).      The exam was otherwise without abnormality on direct and retroflexion       views. Impression:            - The examined portion of the ileum was normal.                        - Normal mucosa in the entire examined colon. Biopsied.                        - One 1 mm polyp in the descending colon, removed with                         a jumbo cold forceps. Resected and retrieved.                        - One 3 mm polyp in the descending colon, removed with                         a cold snare. Resected and retrieved.                        -  Internal hemorrhoids.                        - The examination was otherwise normal on direct and                         retroflexion views. Recommendation:        - Discharge patient to home.                        - Resume previous diet.                        - Continue present medications.                        - Await pathology results.                        - Repeat colonoscopy in 5 years for surveillance.                        - Return to referring physician as previously                         scheduled. Procedure Code(s):     --- Professional ---                        [phone], Colonoscopy, flexible; with removal of                         tumor(s), polyp(s),  or other lesion(s) by snare                         technique                        [id number], 59, Colonoscopy, flexible; with biopsy, single                         or multiple Diagnosis Code(s):     --- Professional ---                        K64.0, First degree hemorrhoids                        D12.4, Benign neoplasm of descending colon                        K52.9, Noninfective gastroenteritis and colitis,                         unspecified CPT copyright 2022 [employer]. All rights reserved. The codes documented in this report are preliminary and upon coder review may  be revised to meet current compliance requirements. Eather Colas MD, MD 01/18/2023 1:46:35 PM Number of Addenda: 0 Note Initiated On: 01/18/2023 1:13 PM Scope Withdrawal Time: 0 hours 12 minutes 20 seconds  Total Procedure Duration: 0 hours 17 minutes 15 seconds  Estimated Blood Loss:  Estimated blood loss was minimal.      [facility]

## 2023-01-18 NOTE — Anesthesia Preprocedure Evaluation (Signed)
Anesthesia Evaluation  Patient identified by MRN, date of birth, ID band Patient awake    Reviewed: Allergy & Precautions, H&P , NPO status , Patient's Chart, lab work & pertinent test results, reviewed documented beta blocker date and time   Airway Mallampati: II   Neck ROM: full    Dental  (+) Poor Dentition   Pulmonary neg pulmonary ROS, former smoker   Pulmonary exam normal        Cardiovascular Exercise Tolerance: Good hypertension, On Medications negative cardio ROS Normal cardiovascular exam Rhythm:regular Rate:Normal     Neuro/Psych negative neurological ROS  negative psych ROS   GI/Hepatic negative GI ROS, Neg liver ROS,,,  Endo/Other  negative endocrine ROSdiabetes, Well Controlled    Renal/GU negative Renal ROS  negative genitourinary   Musculoskeletal   Abdominal   Peds  Hematology negative hematology ROS (+)   Anesthesia Other Findings Past Medical History: No date: Allergic rhinitis No date: BPH (benign prostatic hyperplasia) No date: 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 No date: History of kidney stones No date: Hypercholesterolemia No date: Hyperlipidemia No date: Hypertension Past Surgical History: No date: COLONOSCOPY 10/07/2020: COLONOSCOPY WITH PROPOFOL; N/A     Comment:  Procedure: COLONOSCOPY WITH PROPOFOL;  Surgeon:               Regis Bill, MD;  Location: [facility] ENDOSCOPY;                Service: Endoscopy;  Laterality: N/A; No date: CYSTOSCOPY PROSTATE W/ LASER No date: LITHOTRIPSY BMI    Body Mass Index: 27.34 kg/m     Reproductive/Obstetrics negative OB ROS                             Anesthesia Physical Anesthesia Plan  ASA: 2  Anesthesia Plan: General   Post-op Pain Management:    Induction:   PONV Risk Score and Plan:   Airway Management Planned:   Additional Equipment:   Intra-op Plan:    Post-operative Plan:   Informed Consent: I have reviewed the patients History and Physical, chart, labs and discussed the procedure including the risks, benefits and alternatives for the proposed anesthesia with the patient or authorized representative who has indicated his/her understanding and acceptance.     Dental Advisory Given  Plan Discussed with: CRNA  Anesthesia Plan Comments:        Anesthesia Quick Evaluation

## 2023-01-19 ENCOUNTER — Encounter: Payer: Self-pay | Admitting: Gastroenterology

## 2023-01-24 LAB — SURGICAL PATHOLOGY
